# Patient Record
Sex: Female | Born: 1945 | Race: White | Hispanic: No | Marital: Married | Smoking: Never smoker
Health system: Southern US, Community
[De-identification: ages and names within clinical notes are randomized; demographics above are authoritative.]

## PROBLEM LIST (undated history)

## (undated) DIAGNOSIS — E7800 Pure hypercholesterolemia, unspecified: Secondary | ICD-10-CM

## (undated) DIAGNOSIS — I10 Essential (primary) hypertension: Secondary | ICD-10-CM

## (undated) DIAGNOSIS — M1990 Unspecified osteoarthritis, unspecified site: Secondary | ICD-10-CM

## (undated) HISTORY — PX: ABDOMINAL HYSTERECTOMY: SHX81

## (undated) HISTORY — PX: REPLACEMENT TOTAL KNEE: SUR1224

## (undated) HISTORY — PX: TONSILLECTOMY: SUR1361

---

## 2016-05-31 ENCOUNTER — Emergency Department (HOSPITAL_BASED_OUTPATIENT_CLINIC_OR_DEPARTMENT_OTHER)
Admission: EM | Admit: 2016-05-31 | Discharge: 2016-05-31 | Disposition: A | Discharge status: Home / Self Care | Payer: Medicare HMO | Attending: Emergency Medicine | Admitting: Emergency Medicine

## 2016-05-31 ENCOUNTER — Encounter (HOSPITAL_BASED_OUTPATIENT_CLINIC_OR_DEPARTMENT_OTHER): Payer: Self-pay | Admitting: Emergency Medicine

## 2016-05-31 ENCOUNTER — Emergency Department (HOSPITAL_BASED_OUTPATIENT_CLINIC_OR_DEPARTMENT_OTHER): Payer: Medicare HMO

## 2016-05-31 ENCOUNTER — Other Ambulatory Visit: Payer: Self-pay

## 2016-05-31 DIAGNOSIS — I10 Essential (primary) hypertension: Secondary | ICD-10-CM | POA: Diagnosis not present

## 2016-05-31 DIAGNOSIS — Z7982 Long term (current) use of aspirin: Secondary | ICD-10-CM | POA: Diagnosis not present

## 2016-05-31 DIAGNOSIS — M25532 Pain in left wrist: Secondary | ICD-10-CM | POA: Diagnosis not present

## 2016-05-31 DIAGNOSIS — Z79899 Other long term (current) drug therapy: Secondary | ICD-10-CM | POA: Insufficient documentation

## 2016-05-31 HISTORY — DX: Essential (primary) hypertension: I10

## 2016-05-31 HISTORY — DX: Unspecified osteoarthritis, unspecified site: M19.90

## 2016-05-31 HISTORY — DX: Pure hypercholesterolemia, unspecified: E78.00

## 2016-05-31 LAB — COMPREHENSIVE METABOLIC PANEL
ALT: 16 U/L (ref 14–54)
ANION GAP: 9 (ref 5–15)
AST: 12 U/L — ABNORMAL LOW (ref 15–41)
Albumin: 3.5 g/dL (ref 3.5–5.0)
Alkaline Phosphatase: 114 U/L (ref 38–126)
BUN: 15 mg/dL (ref 6–20)
CHLORIDE: 103 mmol/L (ref 101–111)
CO2: 24 mmol/L (ref 22–32)
CREATININE: 0.98 mg/dL (ref 0.44–1.00)
Calcium: 9 mg/dL (ref 8.9–10.3)
GFR calc non Af Amer: 56 mL/min — ABNORMAL LOW (ref >60)
Glucose, Bld: 111 mg/dL — ABNORMAL HIGH (ref 65–99)
POTASSIUM: 4.1 mmol/L (ref 3.5–5.1)
SODIUM: 136 mmol/L (ref 135–145)
Total Bilirubin: 0.4 mg/dL (ref 0.3–1.2)
Total Protein: 6.8 g/dL (ref 6.5–8.1)

## 2016-05-31 LAB — CBC WITH DIFFERENTIAL/PLATELET
Basophils Absolute: 0 10*3/uL (ref 0.0–0.1)
Basophils Relative: 0 %
EOS ABS: 0 10*3/uL (ref 0.0–0.7)
EOS PCT: 0 %
HCT: 39.7 % (ref 36.0–46.0)
Hemoglobin: 13.2 g/dL (ref 12.0–15.0)
LYMPHS ABS: 1 10*3/uL (ref 0.7–4.0)
Lymphocytes Relative: 12 %
MCH: 30.1 pg (ref 26.0–34.0)
MCHC: 33.2 g/dL (ref 30.0–36.0)
MCV: 90.4 fL (ref 78.0–100.0)
Monocytes Absolute: 0.7 10*3/uL (ref 0.1–1.0)
Monocytes Relative: 8 %
Neutro Abs: 7.1 10*3/uL (ref 1.7–7.7)
Neutrophils Relative %: 80 %
PLATELETS: 296 10*3/uL (ref 150–400)
RBC: 4.39 MIL/uL (ref 3.87–5.11)
RDW: 13 % (ref 11.5–15.5)
WBC: 8.9 10*3/uL (ref 4.0–10.5)

## 2016-05-31 LAB — SEDIMENTATION RATE: Sed Rate: 33 mm/hr — ABNORMAL HIGH (ref 0–22)

## 2016-05-31 MED ORDER — NAPROXEN 375 MG PO TABS: 375.0000 mg | ORAL_TABLET | Freq: Two times a day (BID) | ORAL | 0 refills | Status: AC

## 2016-05-31 NOTE — ED Notes (Signed)
Patient presents to ED with left wrist and hand pain that began acutely yesterday morning upon waking.  Patient states the pain was so severe she could not lift a cup of coffee.  Patient also stated last week she was nauseated for several days and broke out into a cold sweat several times.  Patient denies fever, vomiting and diarrhea.  Patient denies chest pain, abdominal pain, dizziness and shortness of breath.   Patient's lung sounds are CTAB.  She is in a-fib and has a dx of the same.  Patient's left wrist is swollen along lateral aspect of distal radius, extending along lateral thumb.  Patient can flex forward at wrist and extend it, but is unable to move left wrist side to side because of pain.  No warmth or redness noted to left wrist.

## 2016-05-31 NOTE — Discharge Instructions (Signed)
Your symptoms are likely due to arthritis. Start taking Naprosyn twice daily. Also apply ice for 10-15 minutes times daily. Follow-up with your primary care physician tomorrow. You need to return to the emergency department if you experience worsening swelling, redness, fever, or any new symptoms as these may be signs of infection.

## 2016-05-31 NOTE — ED Triage Notes (Signed)
Pt c/o left wrist pain radiating to upper arm since last night.  Pt denies any known injury.  Left wrist appears mildly swollen, no redness noted in triage.

## 2016-05-31 NOTE — ED Triage Notes (Signed)
Pt reports nausea and loss of appetite for past several days and states 2 nights ago she woke up in a cold sweat.  Pt states she feels a flush of hot clamminess coming on now.

## 2016-05-31 NOTE — ED Provider Notes (Signed)
[facility] Provider Note   CSN: [id number] Arrival date & time: 05/31/16  1146     History   Chief Complaint Chief Complaint  Patient presents with  . Arm Pain    HPI Sheila Collier is a 70 y.o. female.  HPI Sheila Collier is a 70 y.o. female with PMH significant for high cholesterol, HTN, and arthritis who presents with gradual onset, constant, worsening, moderate-severe left wrist pain that began yesterday.Associated symptoms include swelling and decreased range of motion due to pain. No numbness. She states that the pain will radiate to her elbow. She denies chest pain, shortness of breath, diaphoresis, or dizziness. She states earlier this week she did feel nauseous and had cold sweats, but this has resolved. She denies any fevers, but did feel flushed in triage today. She took tramadol without relief today. She endorses history of atrial fibrillation, but is not on anticoagulation. She takes daily aspirin. She reports left heart cath in March 2016, that was normal.  Past Medical History:  Diagnosis Date  . Arthritis   . High cholesterol   . Hypertension     There are no active problems to display for this patient.   Past Surgical History:  Procedure Laterality Date  . ABDOMINAL HYSTERECTOMY    . REPLACEMENT TOTAL KNEE Right   . TONSILLECTOMY      OB History    No data available       Home Medications    Prior to Admission medications   Medication Sig Start Date End Date Taking? Authorizing Provider  aspirin 81 MG chewable tablet Chew 81 mg by mouth daily.   Yes Historical Provider, MD  atenolol (TENORMIN) 100 MG tablet Take 100 mg by mouth daily.   Yes Historical Provider, MD  traMADol (ULTRAM) 50 MG tablet Take 50 mg by mouth every 6 (six) hours as needed.   Yes Historical Provider, MD  naproxen (NAPROSYN) 375 MG tablet Take 1 tablet (375 mg total) by mouth 2 (two) times daily. 05/31/16   Gloriann Loan, PA-C    Family History No family  history on file.  Social History Social History  Substance Use Topics  . Smoking status: Never Smoker  . Smokeless tobacco: Never Used  . Alcohol use No     Allergies   Codeine and Penicillins   Review of Systems Review of Systems All other systems negative unless otherwise stated in HPI   Physical Exam Updated Vital Signs BP 149/89 (BP Location: Right Arm)   Pulse 71   Temp 97.6 F (36.4 C) (Oral)   Resp 23   Ht '5\' 7"'  (1.702 m)   Wt 87.1 kg   SpO2 96%   BMI 30.07 kg/m   Physical Exam  Constitutional: She is oriented to person, place, and time. She appears well-developed and well-nourished.  Non-toxic appearance. She does not have a sickly appearance. She does not appear ill.  HENT:  Head: Normocephalic and atraumatic.  Mouth/Throat: Oropharynx is clear and moist.  Eyes: Conjunctivae are normal.  Neck: Normal range of motion. Neck supple.  Cardiovascular: Normal rate.  An irregularly irregular rhythm present.  Pulmonary/Chest: Effort normal and breath sounds normal. No accessory muscle usage or stridor. No respiratory distress. She has no wheezes. She has no rhonchi. She has no rales.  Abdominal: Soft. Bowel sounds are normal. She exhibits no distension. There is no tenderness.  Musculoskeletal: Normal range of motion. She exhibits edema and tenderness.  Left wrist: Mildly swollen and warm  without erythema, induration, or fluctuance.  Decreased ROM in ulnar/radial deviation, flexion, and extension due to pain.  TTP over distal radius and web space between thumb and index finger.   Lymphadenopathy:    She has no cervical adenopathy.  Neurological: She is alert and oriented to person, place, and time.  Speech clear without dysarthria.  Skin: Skin is warm and dry.  Psychiatric: She has a normal mood and affect. Her behavior is normal.     ED Treatments / Results  Labs (all labs ordered are listed, but only abnormal results are displayed) Labs Reviewed    COMPREHENSIVE METABOLIC PANEL - Abnormal; Notable for the following:       Result Value   Glucose, Bld 111 (*)    AST 12 (*)    GFR calc non Af Amer 56 (*)    All other components within normal limits  SEDIMENTATION RATE - Abnormal; Notable for the following:    Sed Rate 33 (*)    All other components within normal limits  CBC WITH DIFFERENTIAL/PLATELET    EKG  EKG Interpretation  Date/Time:  Sunday May 31 2016 12:11:37 EDT Ventricular Rate:  57 PR Interval:    QRS Duration: 74 QT Interval:  400 QTC Calculation: 389 R Axis:   8 Text Interpretation:  Atrial fibrillation with slow ventricular response Anteroseptal infarct , age undetermined ST & T wave abnormality, consider inferolateral ischemia Abnormal ECG No previous ECGs available Confirmed by LITTLE MD, RACHEL [id number]) on 05/31/2016 12:29:52 PM       Radiology Dg Wrist Complete Left  Result Date: 05/31/2016 CLINICAL DATA:  Left posterior wrist swelling and redness with NKI, pain throughout joint radiating up arm towards elbow EXAM: LEFT WRIST - COMPLETE 3+ VIEW COMPARISON:  None. FINDINGS: Degenerative changes are identified in the wrists, primarily at the first carpometacarpal joint and scaphotrapezium. No evidence for acute fracture or subluxation. Note is made of degenerative changes, possibly erosive arthritis at the fifth distal interphalangeal joint. IMPRESSION: No evidence for acute  abnormality.  Degenerative changes. Electronically Signed   By: Nolon Nations M.D.   On: 05/31/2016 13:07    Procedures Procedures (including critical care time)  Medications Ordered in ED Medications - No data to display   Initial Impression / Assessment and Plan / ED Course  I have reviewed the triage vital signs and the nursing notes.  Pertinent labs & imaging results that were available during my care of the patient were reviewed by me and considered in my medical decision making (see chart for details).  Clinical  Course   Patient presents with atraumatic left wrist pain with associated swelling and mild warmth, decreased range of motion. No induration or fluctuance. No systemic symptoms. Vitals reassuring. No leukocytosis. ESR mildly elevated at 33. Plain films showed degenerative changes in the wrists with respect arthritis of the fifth DIPJ. Spoke with Dr. Grandville Silos, hand surgery, suspect likely inflammatory etiology given no leukocytosis, evidence of arthritic changes on xray, and hx of OA.  Offered IR drainage for further evaluation; however, patient deferred.  Will treat with naproxen.  Discussed if failed anti-inflammatory response will need aspiration to evaluate for infection.  Close follow up with PCP.  Return precautions discussed.  Stable for discharge.  Case has been discussed with and seen by Dr. Rex Kras who agrees with the above plan for discharge.   Final Clinical Impressions(s) / ED Diagnoses   Final diagnoses:  Left wrist pain    New Prescriptions New Prescriptions  NAPROXEN (NAPROSYN) 375 MG TABLET    Take 1 tablet (375 mg total) by mouth 2 (two) times daily.     Gloriann Loan, PA-C 05/31/16 [place], MD 06/01/16 [phone]

## 2016-05-31 NOTE — ED Notes (Signed)
Pt given d/c instructions as pe chart. Verbalizes understanding. No questions. Rx x 1

## 2018-03-25 IMAGING — CR DG WRIST COMPLETE 3+V*L*
4 series · 4 of 4 positions shown · non-contrast
Comparison: None.

CLINICAL DATA: Left posterior wrist swelling and redness with NKI,
pain throughout joint radiating up arm towards elbow

EXAM:
LEFT WRIST - COMPLETE 3+ VIEW

[x wrist pa left]
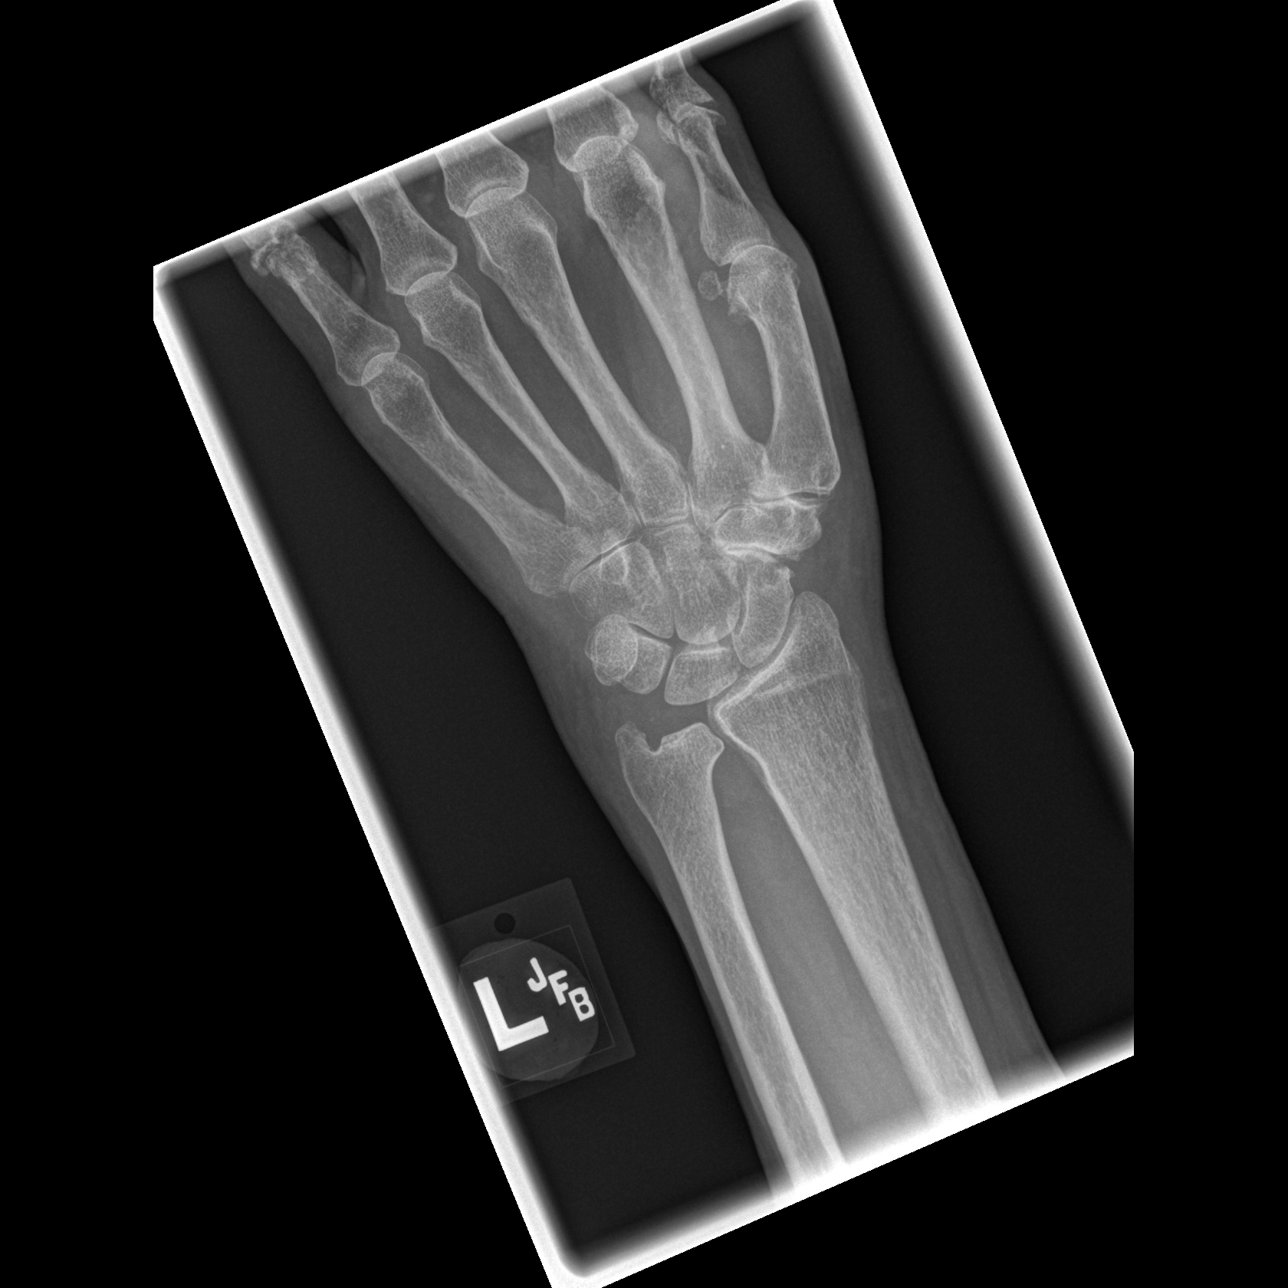

[x wrist obl left]
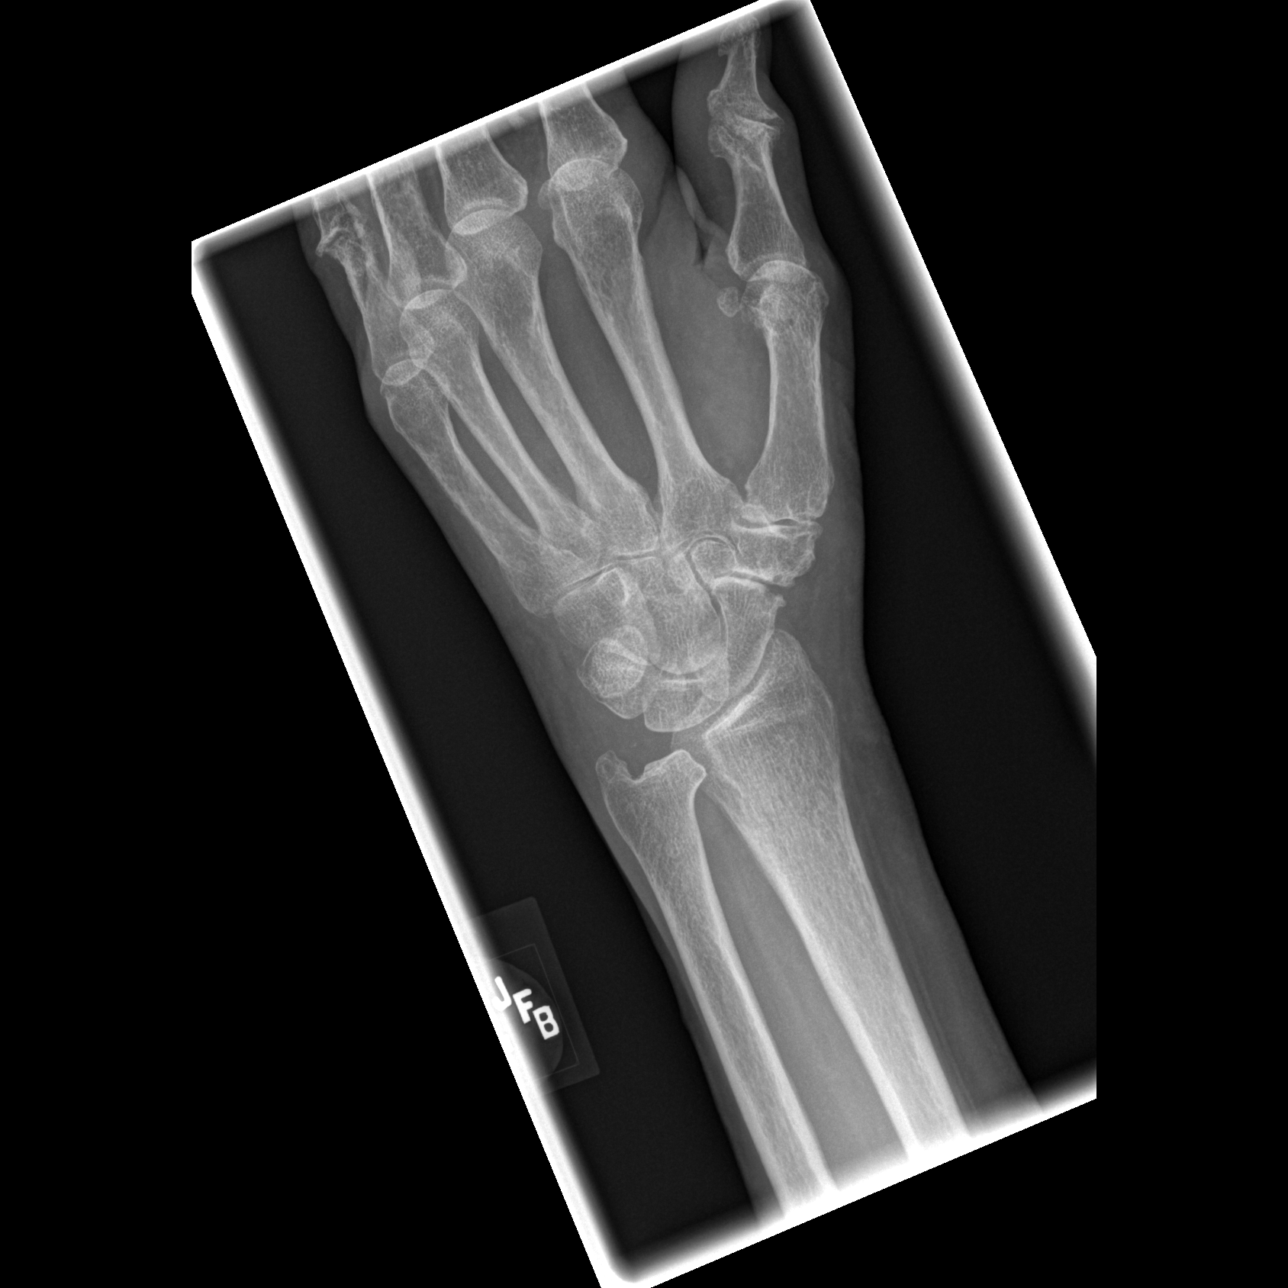

[x wrist lat left]
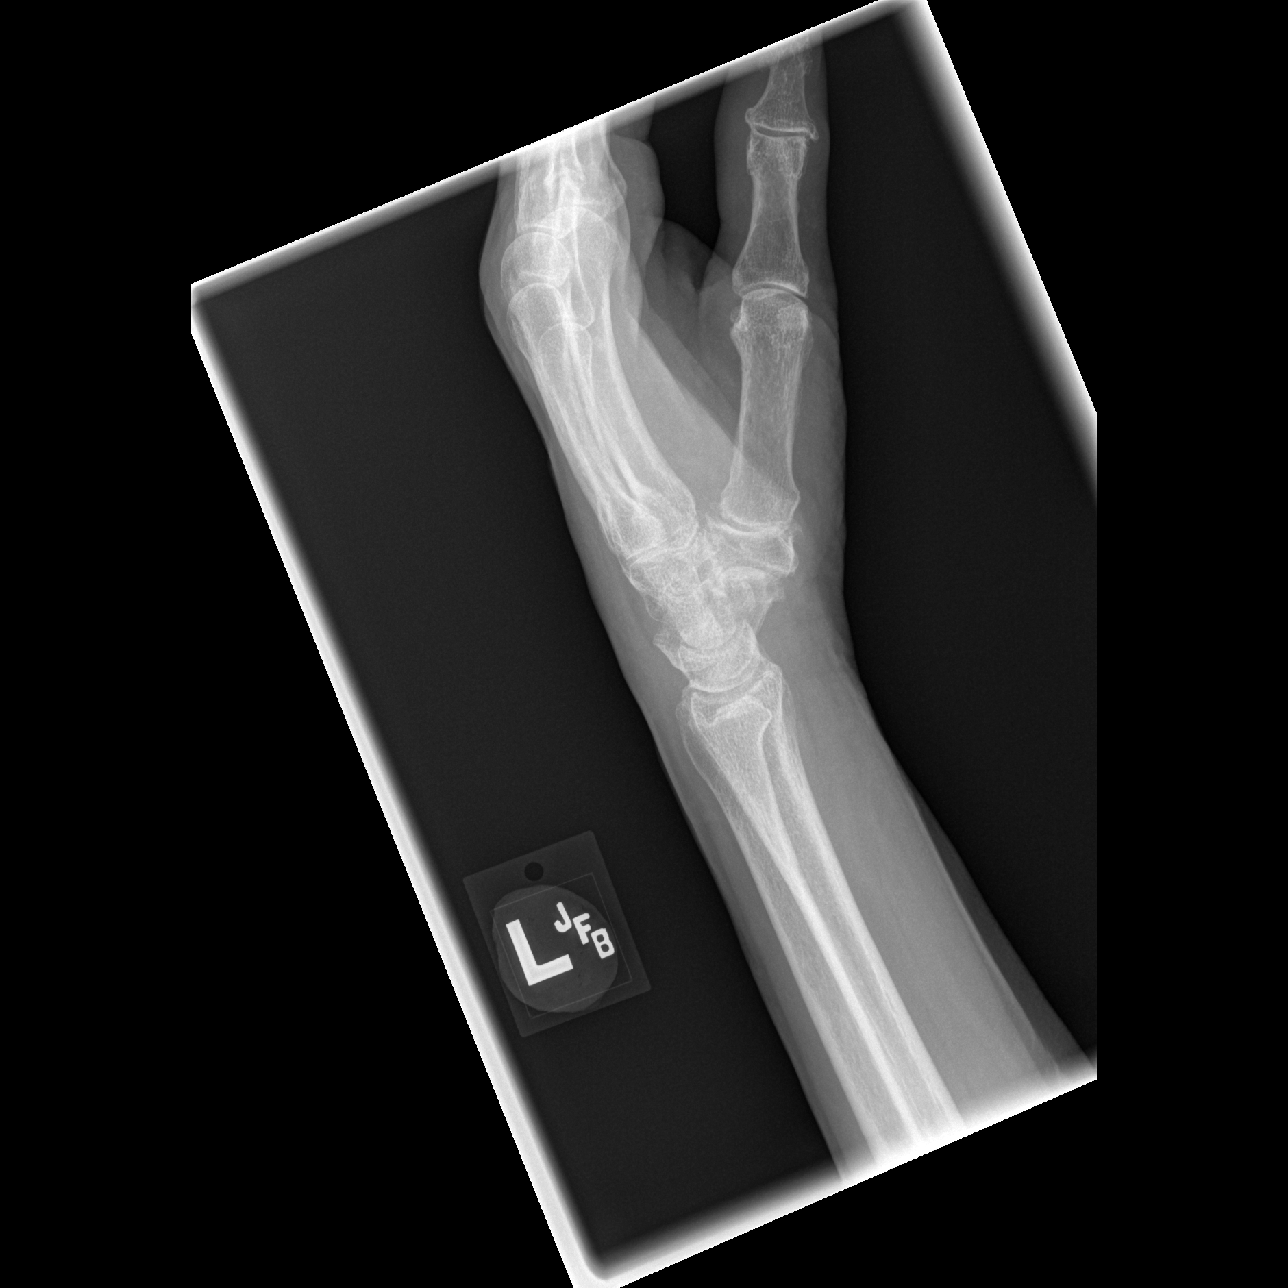

[x navicular]
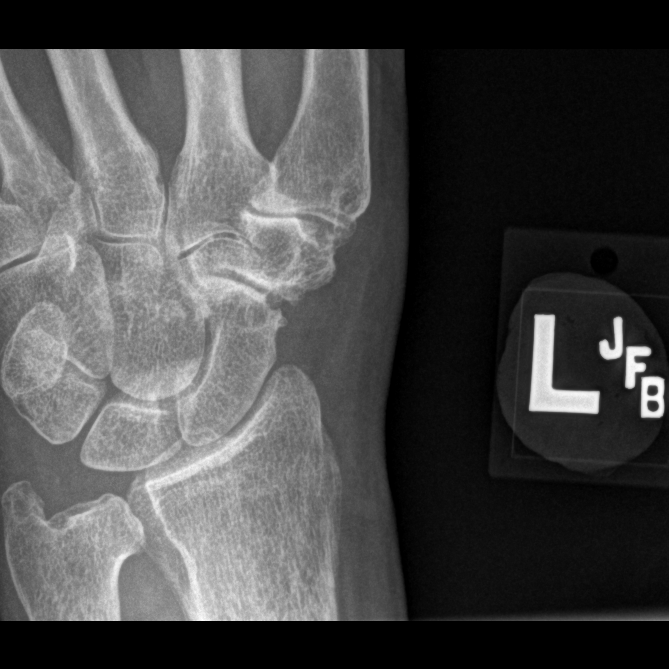

[4 of 4 positions shown; findings below may reference images not displayed]

FINDINGS: Degenerative changes are identified in the wrists, primarily at the
first carpometacarpal joint and scaphotrapezium. No evidence for
acute fracture or subluxation. Note is made of degenerative changes,
possibly erosive arthritis at the fifth distal interphalangeal
joint.
IMPRESSION: No evidence for acute  abnormality.  Degenerative changes.
